# Patient Record
Sex: Female | Born: 1995 | Race: White | Hispanic: No | Marital: Single | Smoking: Never smoker
Health system: Southern US, Community
[De-identification: ages and names within clinical notes are randomized; demographics above are authoritative.]

## PROBLEM LIST (undated history)

## (undated) DIAGNOSIS — J45909 Unspecified asthma, uncomplicated: Secondary | ICD-10-CM

---
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
## 2015-08-12 ENCOUNTER — Encounter (HOSPITAL_COMMUNITY): Payer: Self-pay | Admitting: Emergency Medicine

## 2015-08-12 DIAGNOSIS — R1030 Lower abdominal pain, unspecified: Secondary | ICD-10-CM | POA: Diagnosis not present

## 2015-08-12 DIAGNOSIS — Z3A01 Less than 8 weeks gestation of pregnancy: Secondary | ICD-10-CM | POA: Insufficient documentation

## 2015-08-12 DIAGNOSIS — J45909 Unspecified asthma, uncomplicated: Secondary | ICD-10-CM | POA: Diagnosis not present

## 2015-08-12 DIAGNOSIS — O99511 Diseases of the respiratory system complicating pregnancy, first trimester: Secondary | ICD-10-CM | POA: Insufficient documentation

## 2015-08-12 DIAGNOSIS — O9989 Other specified diseases and conditions complicating pregnancy, childbirth and the puerperium: Secondary | ICD-10-CM | POA: Insufficient documentation

## 2015-08-12 LAB — CBC
HEMATOCRIT: 36 % (ref 36.0–46.0)
Hemoglobin: 12.6 g/dL (ref 12.0–15.0)
MCH: 30.4 pg (ref 26.0–34.0)
MCHC: 35 g/dL (ref 30.0–36.0)
MCV: 86.7 fL (ref 78.0–100.0)
PLATELETS: 316 10*3/uL (ref 150–400)
RBC: 4.15 MIL/uL (ref 3.87–5.11)
RDW: 12.4 % (ref 11.5–15.5)
WBC: 12.1 10*3/uL — AB (ref 4.0–10.5)

## 2015-08-12 LAB — POC URINE PREG, ED: Preg Test, Ur: POSITIVE — AB

## 2015-08-12 LAB — COMPREHENSIVE METABOLIC PANEL
ALT: 16 U/L (ref 14–54)
AST: 28 U/L (ref 15–41)
Albumin: 3.9 g/dL (ref 3.5–5.0)
Alkaline Phosphatase: 60 U/L (ref 38–126)
Anion gap: 10 (ref 5–15)
BUN: 13 mg/dL (ref 6–20)
CHLORIDE: 104 mmol/L (ref 101–111)
CO2: 24 mmol/L (ref 22–32)
Calcium: 9.3 mg/dL (ref 8.9–10.3)
Creatinine, Ser: 0.57 mg/dL (ref 0.44–1.00)
Glucose, Bld: 110 mg/dL — ABNORMAL HIGH (ref 65–99)
POTASSIUM: 4.1 mmol/L (ref 3.5–5.1)
Sodium: 138 mmol/L (ref 135–145)
Total Bilirubin: 0.4 mg/dL (ref 0.3–1.2)
Total Protein: 7 g/dL (ref 6.5–8.1)

## 2015-08-12 LAB — LIPASE, BLOOD: LIPASE: 33 U/L (ref 11–51)

## 2015-08-12 NOTE — ED Notes (Signed)
Pt. reports low abdominal pain with nausea onset 2 weeks ago , denies emesis or diarrhea , no fever or chills. Pt. stated heavy pushing/lifting at work .

## 2015-08-13 ENCOUNTER — Emergency Department (HOSPITAL_COMMUNITY): Payer: BLUE CROSS/BLUE SHIELD

## 2015-08-13 ENCOUNTER — Emergency Department (HOSPITAL_COMMUNITY)
Admission: EM | Admit: 2015-08-13 | Discharge: 2015-08-13 | Disposition: A | Discharge status: Home / Self Care | Payer: BLUE CROSS/BLUE SHIELD | Attending: Emergency Medicine | Admitting: Emergency Medicine

## 2015-08-13 DIAGNOSIS — O26899 Other specified pregnancy related conditions, unspecified trimester: Secondary | ICD-10-CM

## 2015-08-13 DIAGNOSIS — N939 Abnormal uterine and vaginal bleeding, unspecified: Secondary | ICD-10-CM

## 2015-08-13 DIAGNOSIS — R1030 Lower abdominal pain, unspecified: Secondary | ICD-10-CM

## 2015-08-13 DIAGNOSIS — Z3490 Encounter for supervision of normal pregnancy, unspecified, unspecified trimester: Secondary | ICD-10-CM

## 2015-08-13 DIAGNOSIS — R102 Pelvic and perineal pain: Secondary | ICD-10-CM

## 2015-08-13 DIAGNOSIS — O9989 Other specified diseases and conditions complicating pregnancy, childbirth and the puerperium: Secondary | ICD-10-CM | POA: Diagnosis not present

## 2015-08-13 HISTORY — DX: Unspecified asthma, uncomplicated: J45.909

## 2015-08-13 LAB — URINALYSIS, ROUTINE W REFLEX MICROSCOPIC
BILIRUBIN URINE: NEGATIVE
Glucose, UA: NEGATIVE mg/dL
HGB URINE DIPSTICK: NEGATIVE
Ketones, ur: NEGATIVE mg/dL
Leukocytes, UA: NEGATIVE
NITRITE: NEGATIVE
PROTEIN: NEGATIVE mg/dL
Specific Gravity, Urine: 1.022 (ref 1.005–1.030)
pH: 7.5 (ref 5.0–8.0)

## 2015-08-13 LAB — URINE MICROSCOPIC-ADD ON

## 2015-08-13 LAB — HCG, QUANTITATIVE, PREGNANCY: HCG, BETA CHAIN, QUANT, S: 279040 m[IU]/mL — AB (ref <5)

## 2015-08-13 NOTE — ED Provider Notes (Signed)
CSN: [id number]     Arrival date & time 08/12/15  2043 History  By signing my name below, I, Freida Busman, attest that this documentation has been prepared under the direction and in the presence of Azalia Bilis, MD . Electronically Signed: Freida Busman, Scribe. 08/13/2015. 2:54 AM.  Chief Complaint  Patient presents with  . Abdominal Pain    The history is provided by the patient. No language interpreter was used.    HPI Comments:  Michelle Norton is a 20 y.o. female who presents to the Emergency Department complaining of intermittent episodes of  sharp abdominal pain x 2 weeks. She reports pain throughout her abdomen and notes the episodes usually last ~  1 minute. She denies vomiting, fever, chills, frequency, urgency and dysuria. Pt is currently pregnant, believes she is ~ 2 months along but has not yet been evaluated by OB. She notes associated mild vaginal bleeding and lower back pain. No alleviating factors noted. Pt has OB appointment scheduled in the next 2 weeks.   She was diagnosed with UTI ~ 3 weeks ago and was placed on antibiotics which she completed and her urinary frequency had resolved.   Past Medical History  Diagnosis Date  . Asthma    History reviewed. No pertinent past surgical history. No family history on file. Social History  Substance Use Topics  . Smoking status: Never Smoker   . Smokeless tobacco: Never Used  . Alcohol Use: No   OB History    No data available     Review of Systems  10 systems reviewed and all are negative for acute change except as noted in the HPI.   Allergies  Review of patient's allergies indicates no known allergies.  Home Medications   Prior to Admission medications   Not on File   BP 131/78 mmHg  Pulse 107  Temp(Src) 98.6 F (37 C) (Oral)  Resp 18  SpO2 100%  LMP 07/10/2015 (Approximate) Physical Exam  Constitutional: She is oriented to person, place, and time. She appears well-developed and well-nourished. No  distress.  HENT:  Head: Normocephalic and atraumatic.  Eyes: EOM are normal.  Neck: Normal range of motion.  Cardiovascular: Normal rate, regular rhythm and normal heart sounds.   Pulmonary/Chest: Effort normal and breath sounds normal.  Abdominal: Soft. She exhibits no distension. There is no tenderness.  Musculoskeletal: Normal range of motion.  Neurological: She is alert and oriented to person, place, and time.  Skin: Skin is warm and dry.  Psychiatric: She has a normal mood and affect. Judgment normal.  Nursing note and vitals reviewed.   ED Course  Procedures   DIAGNOSTIC STUDIES:  Oxygen Saturation is 98% on RA, normal by my interpretation.    COORDINATION OF CARE:  2:40 AM Will order [place].  Discussed treatment plan with pt at bedside and pt agreed to plan.  Labs Review Labs Reviewed  COMPREHENSIVE METABOLIC PANEL - Abnormal; Notable for the following:    Glucose, Bld 110 (*)    All other components within normal limits  CBC - Abnormal; Notable for the following:    WBC 12.1 (*)    All other components within normal limits  URINALYSIS, ROUTINE W REFLEX MICROSCOPIC (NOT AT [facility]) - Abnormal; Notable for the following:    APPearance TURBID (*)    All other components within normal limits  HCG, QUANTITATIVE, PREGNANCY - Abnormal; Notable for the following:    hCG, Beta Francene Finders [id number] (*)    All  other components within normal limits  URINE MICROSCOPIC-ADD ON - Abnormal; Notable for the following:    Squamous Epithelial / LPF 0-5 (*)    Bacteria, UA FEW (*)    All other components within normal limits  POC URINE PREG, ED - Abnormal; Notable for the following:    Preg Test, Ur POSITIVE (*)    All other components within normal limits  LIPASE, BLOOD    Imaging Review US Ob Comp Less 14 Wks  08/13/2015  CLINICAL DATA:  Acute onset of lower abdominal pain and vaginal bleeding. Initial encounter. EXAM: OBSTETRIC <14 WK [place] AND TRANSVAGINAL OB US TECHNIQUE: Both  transabdominal and transvaginal ultrasound examinations were performed for complete evaluation of the gestation as well as the maternal uterus, adnexal regions, and pelvic cul-de-sac. Transvaginal technique was performed to assess early pregnancy. COMPARISON:  None. FINDINGS: Intrauterine gestational sac: Visualized/normal in shape. Yolk sac:  Yes Embryo:  Yes Cardiac Activity: Yes Heart Rate: 182  bpm CRL:  2.1 cm   8 w   5 d                  [place] EDC: 03/19/2016 Subchorionic hemorrhage:  None visualized. Maternal uterus/adnexae: The uterus is otherwise unremarkable. The ovaries are within normal limits. The right ovary measures 3.4 x 2.4 x 2.4 cm, while the left ovary measures 2.4 x 2.3 x 1.2 cm. No suspicious adnexal masses are seen. There is no evidence for ovarian torsion. No free fluid is seen within the pelvic cul-de-sac. IMPRESSION: Single live intrauterine pregnancy noted, with a crown-rump length of 2.1 cm, corresponding to a gestational age of [redacted] weeks 5 days. This does not match the gestational age by LMP, and reflects a new estimated date of delivery of March 19, 2016. Electronically Signed   By: Roanna Raider M.D.   On: 08/13/2015 04:18   US Ob Transvaginal  08/13/2015  CLINICAL DATA:  Acute onset of lower abdominal pain and vaginal bleeding. Initial encounter. EXAM: OBSTETRIC <14 WK [place] AND TRANSVAGINAL OB US TECHNIQUE: Both transabdominal and transvaginal ultrasound examinations were performed for complete evaluation of the gestation as well as the maternal uterus, adnexal regions, and pelvic cul-de-sac. Transvaginal technique was performed to assess early pregnancy. COMPARISON:  None. FINDINGS: Intrauterine gestational sac: Visualized/normal in shape. Yolk sac:  Yes Embryo:  Yes Cardiac Activity: Yes Heart Rate: 182  bpm CRL:  2.1 cm   8 w   5 d                  [place] EDC: 03/19/2016 Subchorionic hemorrhage:  None visualized. Maternal uterus/adnexae: The uterus is otherwise unremarkable. The  ovaries are within normal limits. The right ovary measures 3.4 x 2.4 x 2.4 cm, while the left ovary measures 2.4 x 2.3 x 1.2 cm. No suspicious adnexal masses are seen. There is no evidence for ovarian torsion. No free fluid is seen within the pelvic cul-de-sac. IMPRESSION: Single live intrauterine pregnancy noted, with a crown-rump length of 2.1 cm, corresponding to a gestational age of [redacted] weeks 5 days. This does not match the gestational age by LMP, and reflects a new estimated date of delivery of March 19, 2016. Electronically Signed   By: Roanna Raider M.D.   On: 08/13/2015 04:18   I have personally reviewed and evaluated these images and lab results as part of my medical decision-making.    MDM   Final diagnoses:  Vaginal bleeding  Pelvic pain during pregnancy    Intrauterine  pregnancy.  Outpatient follow-up.  Patient will need to follow-up with OB.  She understands to return to ER for new or worsening symptoms  I personally performed the services described in this documentation, which was scribed in my presence. The recorded information has been reviewed and is accurate.       Azalia Bilis, MD 08/13/15 [phone]

## 2015-08-13 NOTE — ED Notes (Signed)
MD at bedside. 

## 2017-03-28 IMAGING — US US OB TRANSVAGINAL
1 series · 13 of 28 positions shown · non-contrast
Comparison: None.

CLINICAL DATA: Acute onset of lower abdominal pain and vaginal
bleeding. Initial encounter.

EXAM:
OBSTETRIC <14 WK US AND TRANSVAGINAL OB US
TECHNIQUE: Both transabdominal and transvaginal ultrasound examinations were
performed for complete evaluation of the gestation as well as the
maternal uterus, adnexal regions, and pelvic cul-de-sac.
Transvaginal technique was performed to assess early pregnancy.

[Series 1: us ob transvaginal · 0.23mm/px · 13 of 36 slices shown]
[im 2/36]
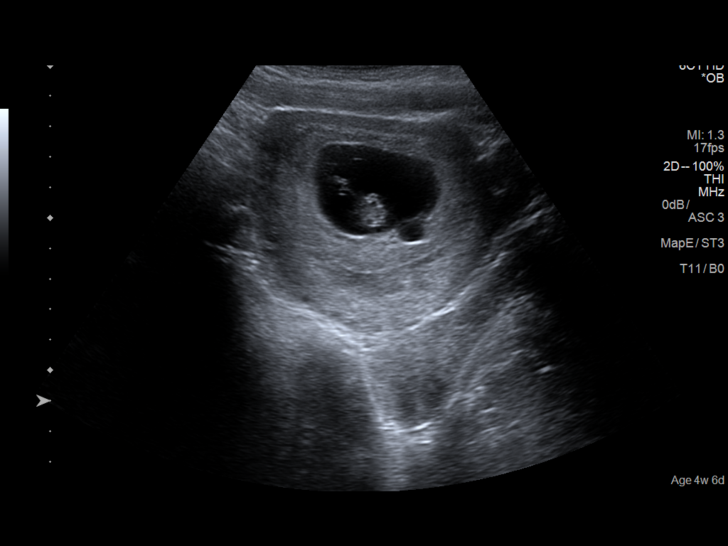
[im 4/36]
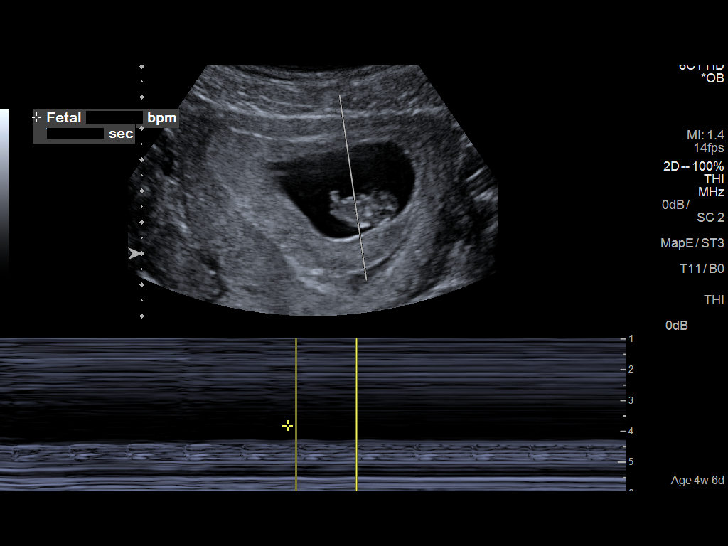
[im 7/36]
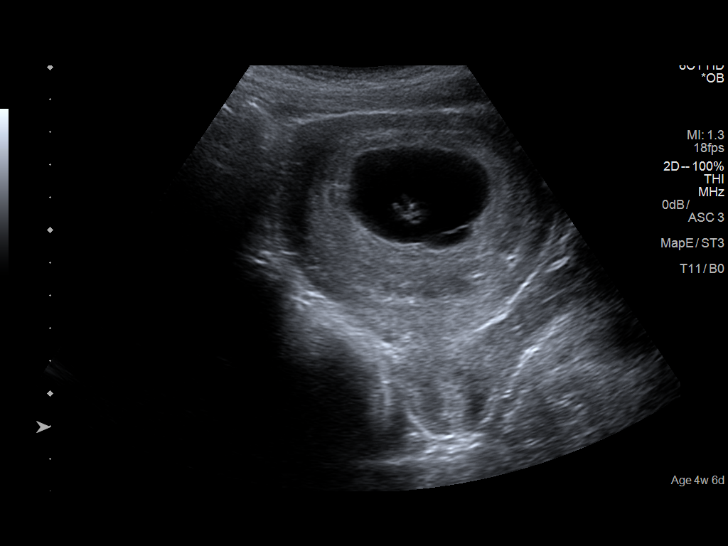
[im 10/36]
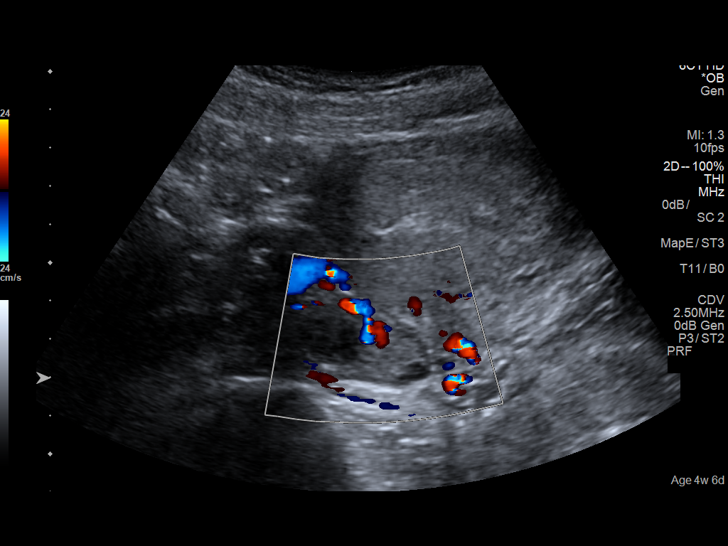
[im 12/36]
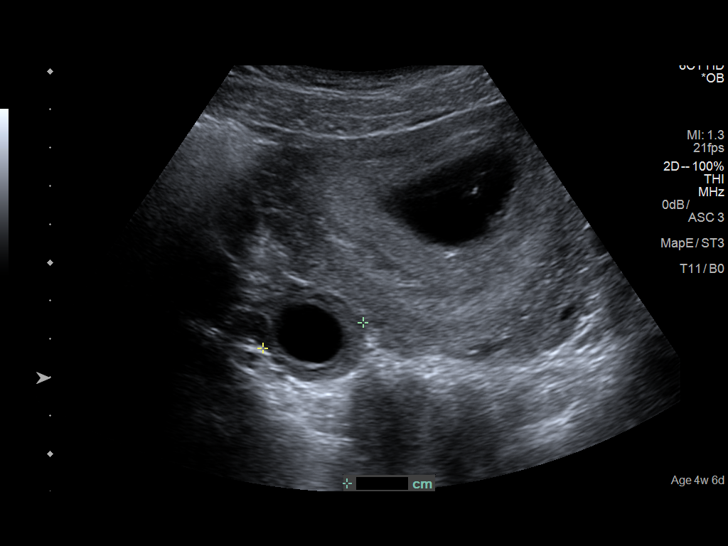
[im 15/36]
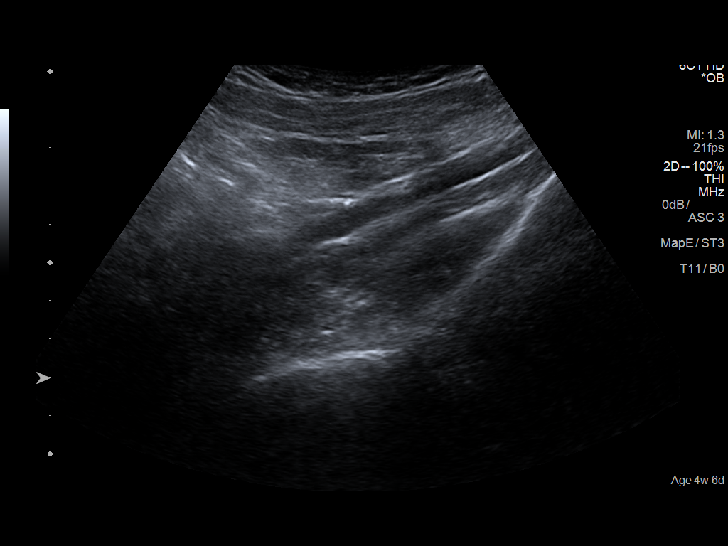
[im 19/36]
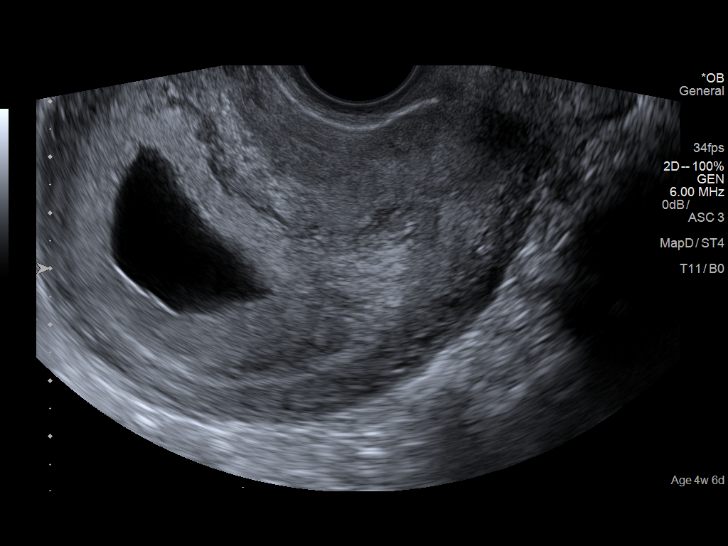
[im 21/36]
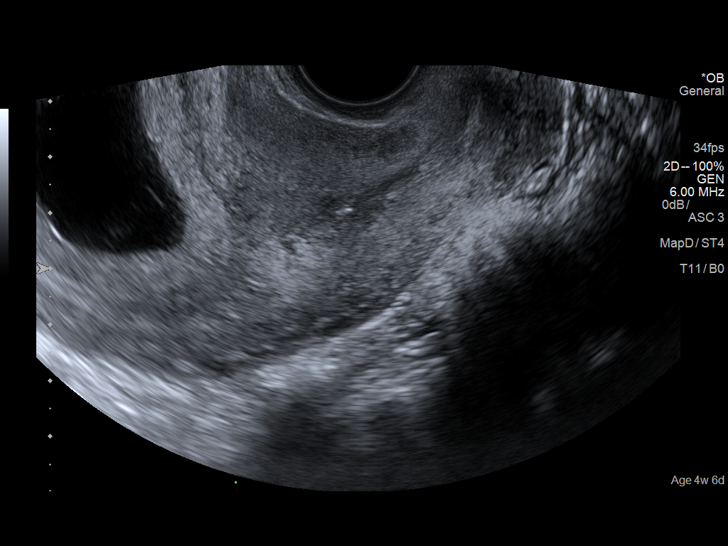
[im 24/36]
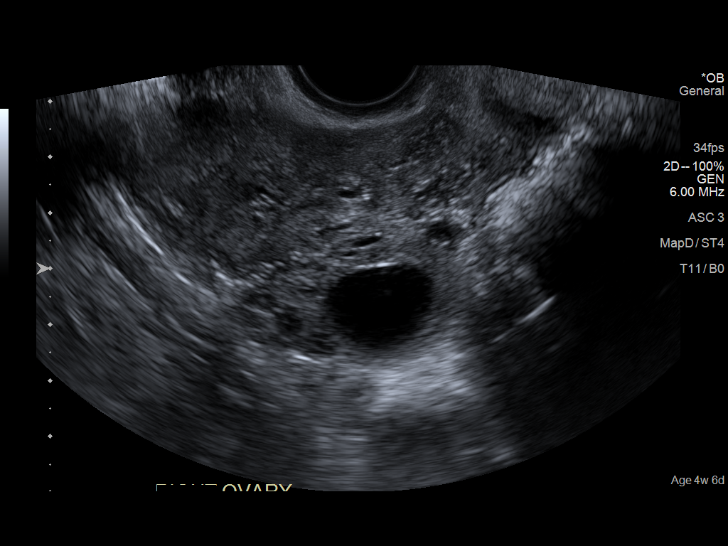
[im 26/36]
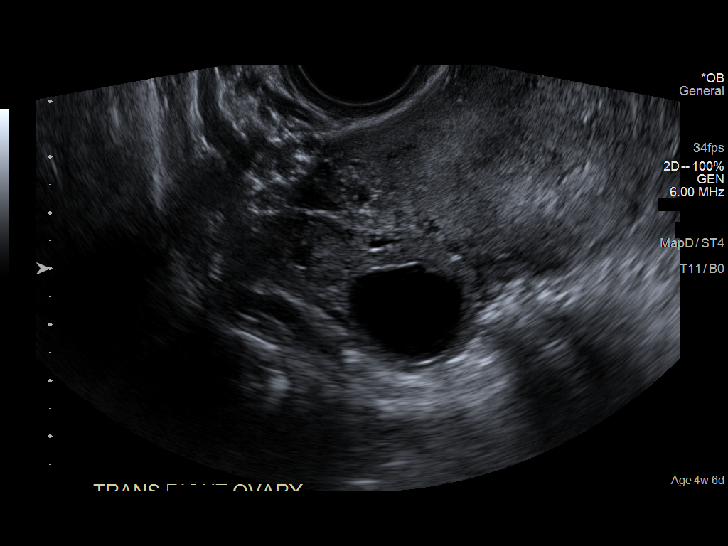
[im 29/36]
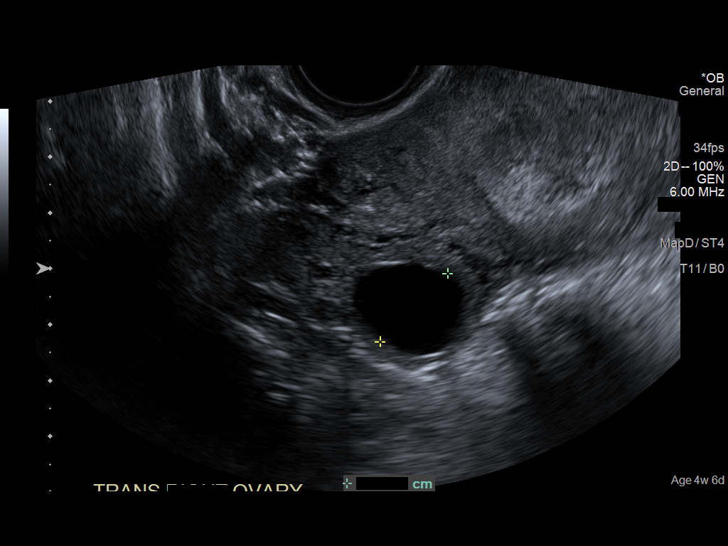
[im 32/36]
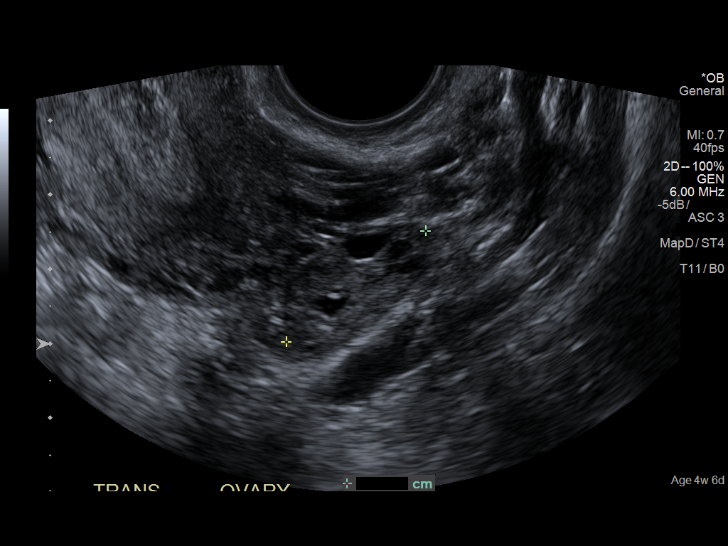
[im 34/36]
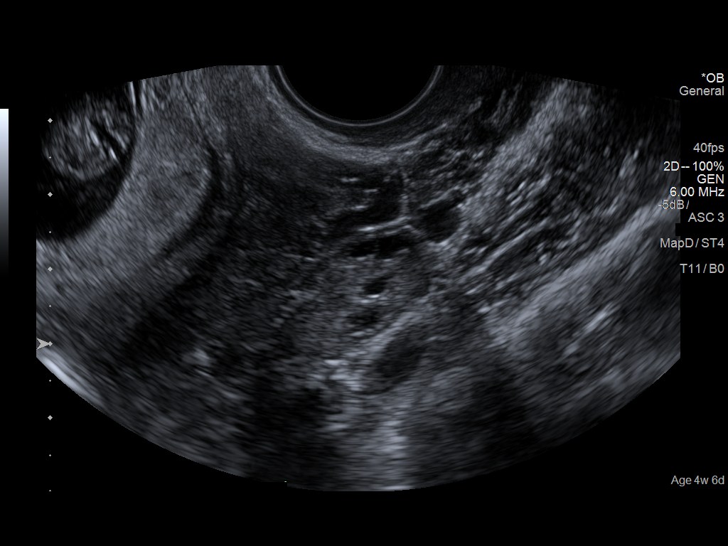

[13 of 28 positions shown; findings below may reference images not displayed]

FINDINGS: Intrauterine gestational sac: Visualized/normal in shape.

Yolk sac:  Yes

Embryo:  Yes

Cardiac Activity: Yes

Heart Rate: 182  bpm

CRL:  2.1 cm   8 w   5 d                  US EDC: 03/19/2016

Subchorionic hemorrhage:  None visualized.

Maternal uterus/adnexae: The uterus is otherwise unremarkable.

The ovaries are within normal limits. The right ovary measures 3.4 x
2.4 x 2.4 cm, while the left ovary measures 2.4 x 2.3 x 1.2 cm. No
suspicious adnexal masses are seen. There is no evidence for ovarian
torsion.

No free fluid is seen within the pelvic cul-de-sac.
IMPRESSION: Single live intrauterine pregnancy noted, with a crown-rump length
of 2.1 cm, corresponding to a gestational age of 8 weeks 5 days.
This does not match the gestational age by LMP, and reflects a new
estimated date of delivery March 19, 2016.
